# Patient Record
Sex: Male | Born: 1995 | Race: White | Hispanic: No | Marital: Single | Smoking: Current every day smoker
Health system: Southern US, Community
[De-identification: ages and names within clinical notes are randomized; demographics above are authoritative.]

## PROBLEM LIST (undated history)

## (undated) HISTORY — PX: FACIAL RECONSTRUCTION SURGERY: SHX631

---
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
## 2017-06-22 ENCOUNTER — Encounter: Payer: Self-pay | Admitting: Emergency Medicine

## 2017-06-22 ENCOUNTER — Emergency Department: Payer: Worker's Compensation

## 2017-06-22 ENCOUNTER — Other Ambulatory Visit: Payer: Self-pay

## 2017-06-22 ENCOUNTER — Emergency Department
Admission: EM | Admit: 2017-06-22 | Discharge: 2017-06-22 | Disposition: A | Discharge status: Home / Self Care | Payer: Worker's Compensation | Attending: Student in an Organized Health Care Education/Training Program | Admitting: Student in an Organized Health Care Education/Training Program

## 2017-06-22 DIAGNOSIS — Y990 Civilian activity done for income or pay: Secondary | ICD-10-CM | POA: Diagnosis not present

## 2017-06-22 DIAGNOSIS — Y929 Unspecified place or not applicable: Secondary | ICD-10-CM | POA: Insufficient documentation

## 2017-06-22 DIAGNOSIS — S61409A Unspecified open wound of unspecified hand, initial encounter: Secondary | ICD-10-CM

## 2017-06-22 DIAGNOSIS — F17200 Nicotine dependence, unspecified, uncomplicated: Secondary | ICD-10-CM | POA: Diagnosis not present

## 2017-06-22 DIAGNOSIS — S66829A Laceration of other specified muscles, fascia and tendons at wrist and hand level, unspecified hand, initial encounter: Secondary | ICD-10-CM

## 2017-06-22 DIAGNOSIS — S66320A Laceration of extensor muscle, fascia and tendon of right index finger at wrist and hand level, initial encounter: Secondary | ICD-10-CM | POA: Insufficient documentation

## 2017-06-22 DIAGNOSIS — Y9389 Activity, other specified: Secondary | ICD-10-CM | POA: Insufficient documentation

## 2017-06-22 DIAGNOSIS — W25XXXA Contact with sharp glass, initial encounter: Secondary | ICD-10-CM | POA: Insufficient documentation

## 2017-06-22 DIAGNOSIS — S61411A Laceration without foreign body of right hand, initial encounter: Secondary | ICD-10-CM

## 2017-06-22 DIAGNOSIS — S6991XA Unspecified injury of right wrist, hand and finger(s), initial encounter: Secondary | ICD-10-CM | POA: Diagnosis present

## 2017-06-22 MED ORDER — LIDOCAINE HCL (PF) 1 % IJ SOLN
5.0000 mL | Freq: Once | INTRAMUSCULAR | Status: AC
  Administered 2017-06-22: 5 mL
  Filled 2017-06-22: qty 5

## 2017-06-22 NOTE — Discharge Instructions (Signed)
You have sustained a laceration to the extensor tendon of the index finger. Wear the splint until you are evaluated by ortho. Keep the wound clean, dry, and covered. Take Tylenol for pain relief as needed. Apply ice to reduce any swelling. Return to the ED immediately for signs of increased pain or swelling into the fingers.
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
## 2018-10-28 IMAGING — DX DG HAND COMPLETE 3+V*R*
3 series · 3 of 3 positions shown · non-contrast
Comparison: Right hand x-rays dated June 23, 2013.

CLINICAL DATA: Right index finger laceration.

EXAM:
RIGHT HAND - COMPLETE 3+ VIEW

[hand ap]
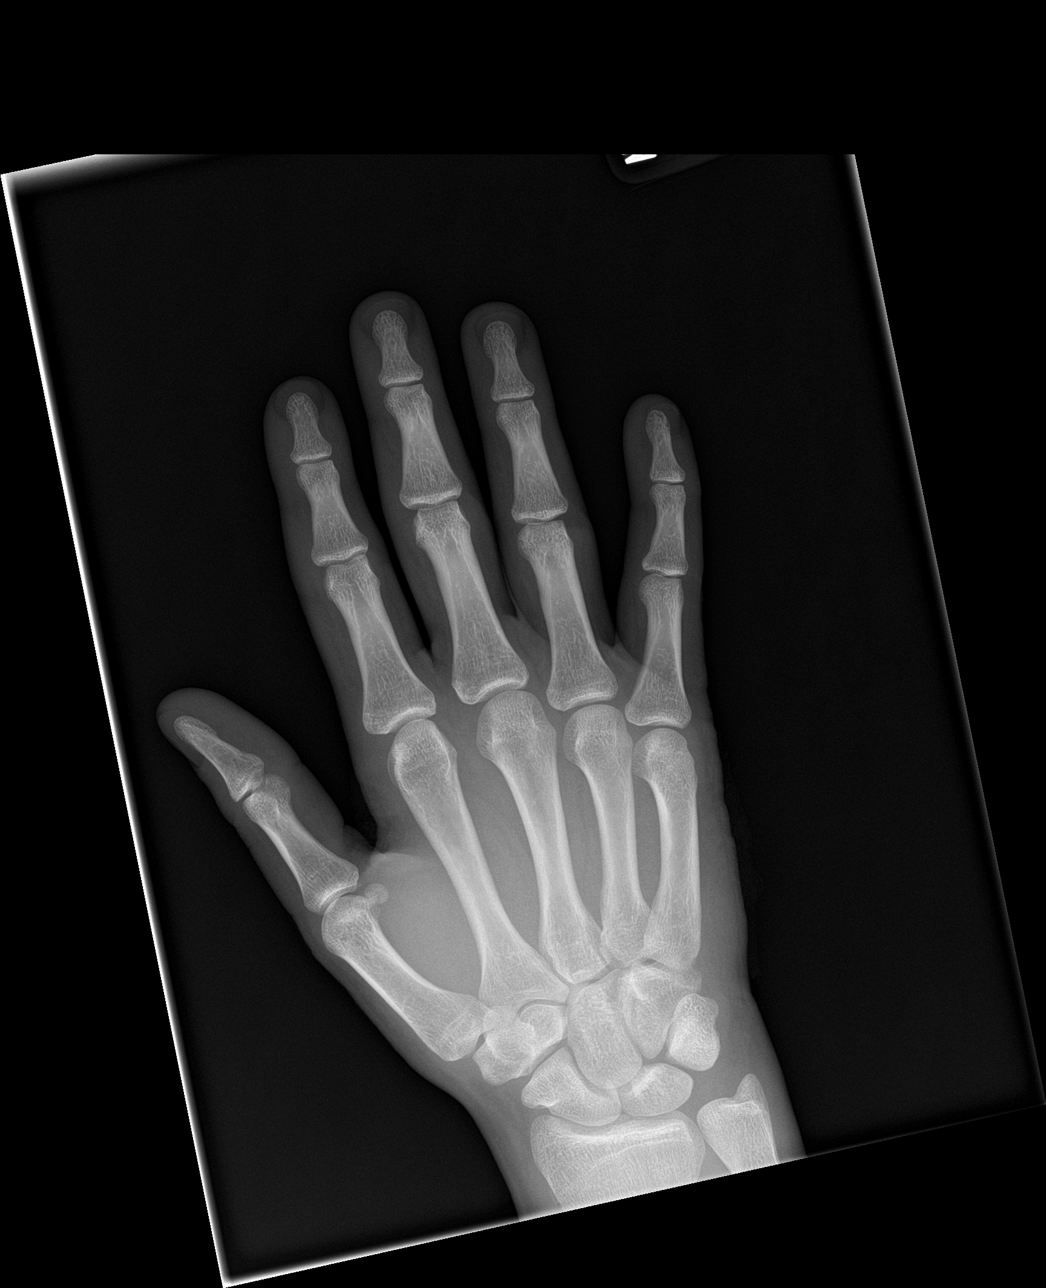

[hand obl]
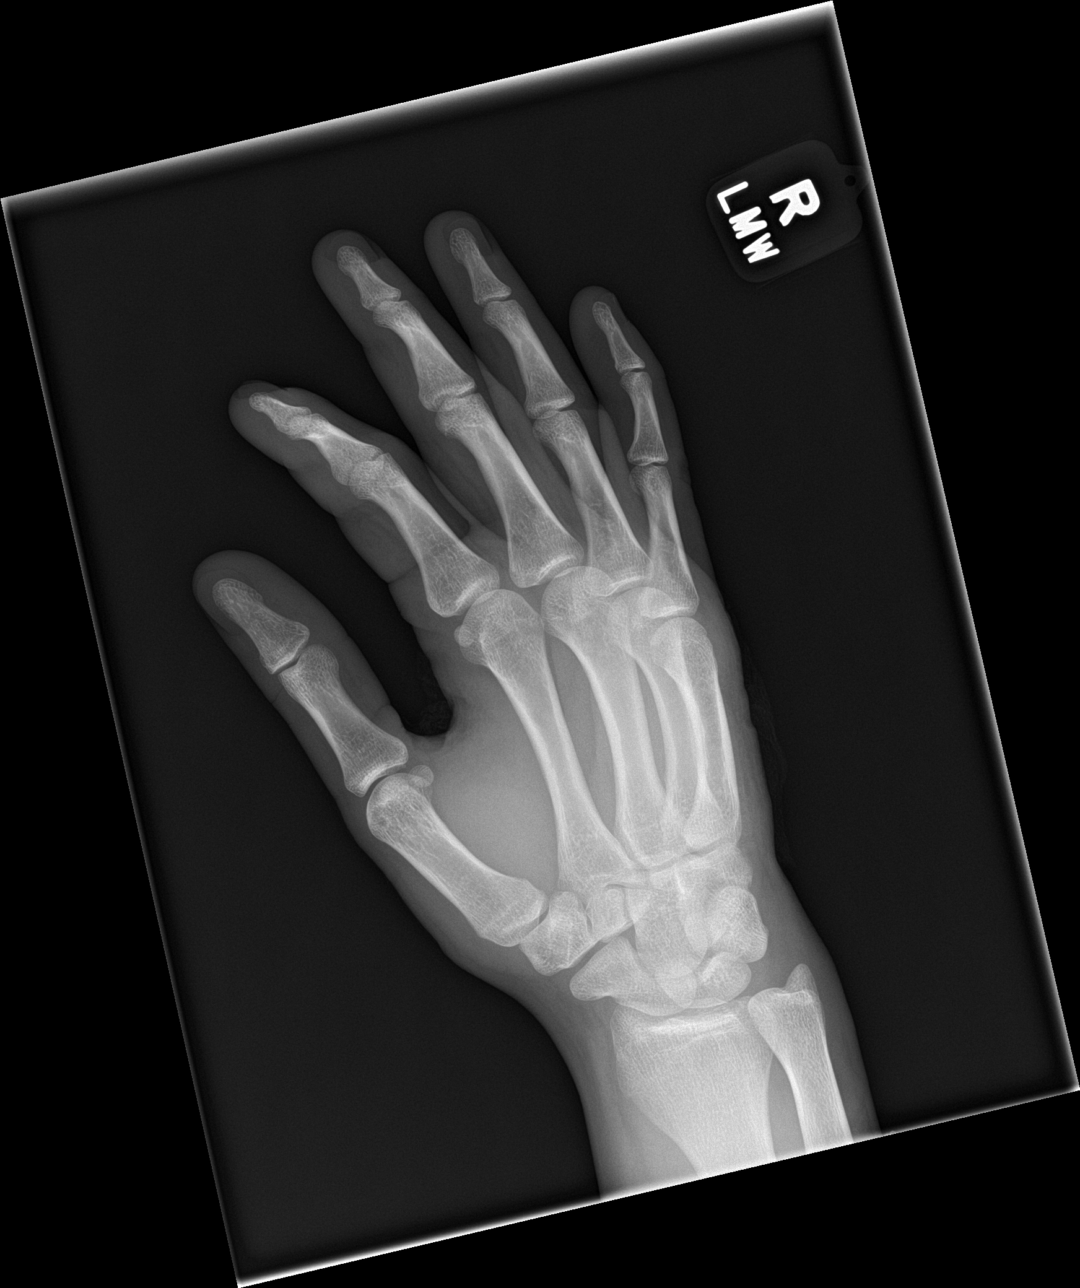

[hand lat]
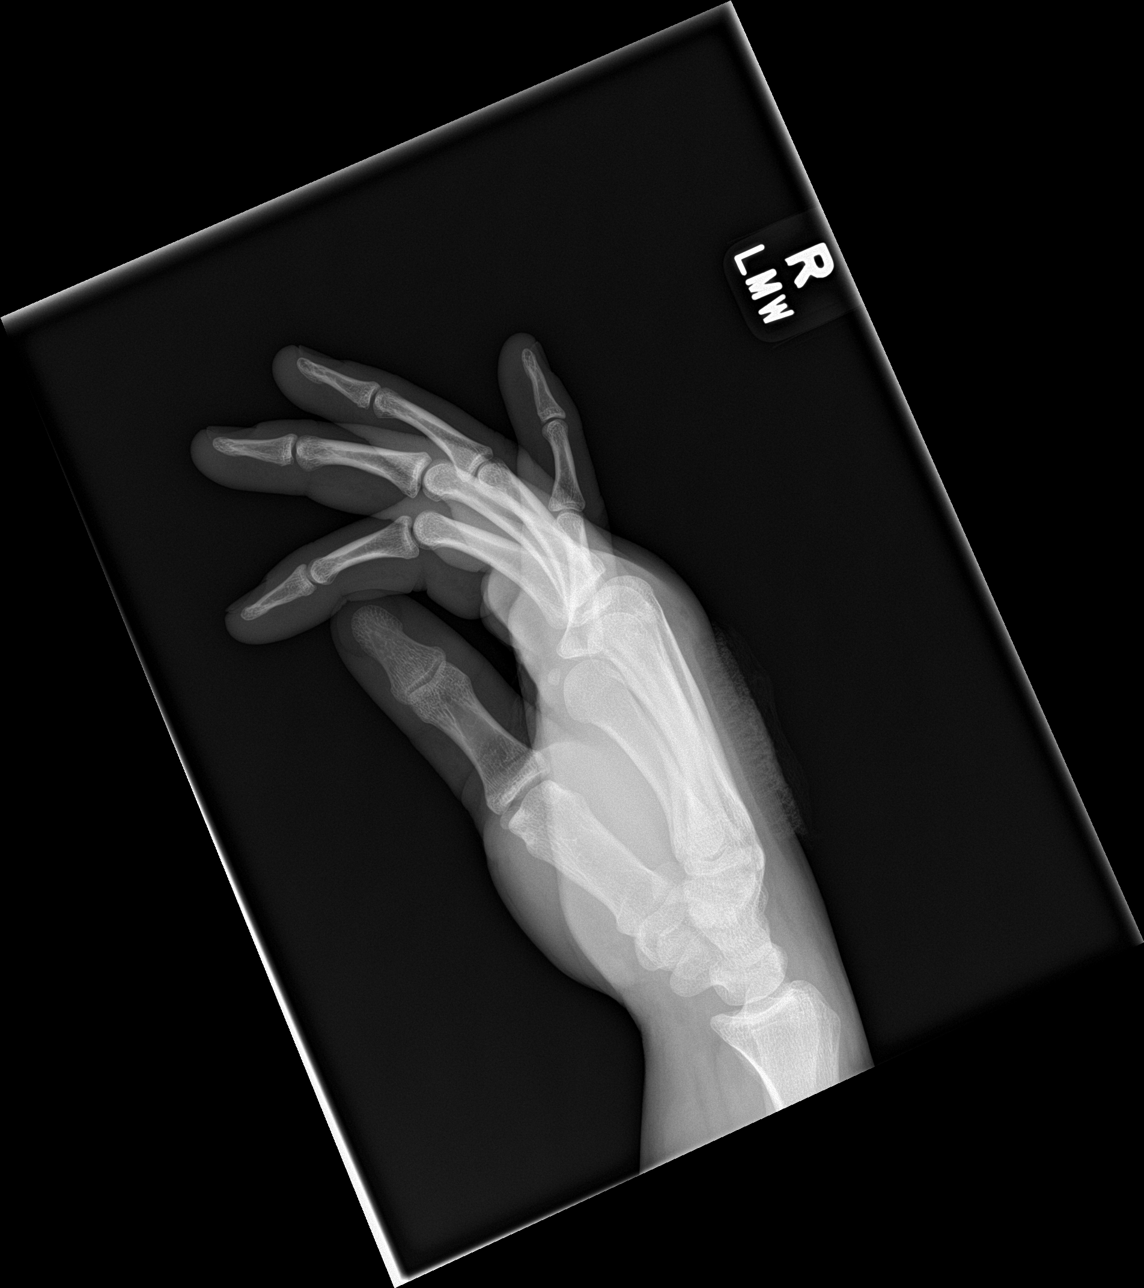

[3 of 3 positions shown; findings below may reference images not displayed]

FINDINGS: There is no evidence of fracture or dislocation. There is no
evidence of arthropathy or other focal bone abnormality. Soft
tissues are unremarkable. No radiopaque foreign body.
IMPRESSION: Negative.
